# Patient Record
Sex: Female | Born: 1980 | Marital: Single | Smoking: Never smoker
Health system: Southern US, Community
[De-identification: ages and names within clinical notes are randomized; demographics above are authoritative.]

## PROBLEM LIST (undated history)

## (undated) DIAGNOSIS — E119 Type 2 diabetes mellitus without complications: Secondary | ICD-10-CM

## (undated) HISTORY — DX: Type 2 diabetes mellitus without complications: E11.9

## (undated) HISTORY — PX: NO PAST SURGERIES: SHX2092

---

## 2011-12-08 ENCOUNTER — Ambulatory Visit: Payer: Self-pay | Admitting: Obstetrics and Gynecology

## 2012-05-27 ENCOUNTER — Ambulatory Visit: Payer: 59

## 2012-05-27 ENCOUNTER — Ambulatory Visit (INDEPENDENT_AMBULATORY_CARE_PROVIDER_SITE_OTHER): Payer: 59 | Admitting: Family Medicine

## 2012-05-27 VITALS — BP 100/69 | HR 102 | Temp 98.7°F | Resp 17 | Ht 68.5 in | Wt 140.0 lb

## 2012-05-27 DIAGNOSIS — IMO0001 Reserved for inherently not codable concepts without codable children: Secondary | ICD-10-CM | POA: Insufficient documentation

## 2012-05-27 DIAGNOSIS — R05 Cough: Secondary | ICD-10-CM

## 2012-05-27 DIAGNOSIS — Z794 Long term (current) use of insulin: Secondary | ICD-10-CM

## 2012-05-27 DIAGNOSIS — R059 Cough, unspecified: Secondary | ICD-10-CM

## 2012-05-27 DIAGNOSIS — E119 Type 2 diabetes mellitus without complications: Secondary | ICD-10-CM

## 2012-05-27 DIAGNOSIS — R509 Fever, unspecified: Secondary | ICD-10-CM

## 2012-05-27 LAB — POCT CBC
Granulocyte percent: 55.3 %G (ref 37–80)
HCT, POC: 40.3 % (ref 37.7–47.9)
Hemoglobin: 12.6 g/dL (ref 12.2–16.2)
Lymph, poc: 1.8 (ref 0.6–3.4)
MCH, POC: 28.4 pg (ref 27–31.2)
MCHC: 31.3 g/dL — AB (ref 31.8–35.4)
MCV: 90.8 fL (ref 80–97)
MID (cbc): 0.4 (ref 0–0.9)
MPV: 10.4 fL (ref 0–99.8)
POC Granulocyte: 2.7 (ref 2–6.9)
POC LYMPH PERCENT: 36.2 %L (ref 10–50)
POC MID %: 8.5 %M (ref 0–12)
Platelet Count, POC: 181 10*3/uL (ref 142–424)
RBC: 4.44 M/uL (ref 4.04–5.48)
RDW, POC: 12.4 %
WBC: 4.9 10*3/uL (ref 4.6–10.2)

## 2012-05-27 MED ORDER — AZITHROMYCIN 250 MG PO TABS: ORAL_TABLET | ORAL | Status: DC

## 2012-05-27 NOTE — Progress Notes (Signed)
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  The cough has persisted.  Objective:  NAD, alert and appropriate HEENT:  Unremarkable. Chest: few rales anterior right chest Heart: regular around 90 bpm, no murmur or gallop Ext:  No edema.  [facility] reading (PRIMARY) by  Dr. Milus Glazier  CXR- right middle lobe infiltrate  Results for orders placed in visit on 05/27/12  POCT CBC      Result Value Range   WBC 4.9  4.6 - 10.2 K/uL   Lymph, poc 1.8  0.6 - 3.4   POC LYMPH PERCENT 36.2  10 - 50 %L   MID (cbc) 0.4  0 - 0.9   POC MID % 8.5  0 - 12 %M   POC Granulocyte 2.7  2 - 6.9   Granulocyte percent 55.3  37 - 80 %G   RBC 4.44  4.04 - 5.48 M/uL   Hemoglobin 12.6  12.2 - 16.2 g/dL   HCT, POC 16.1  09.6 - 47.9 %   MCV 90.8  80 - 97 fL   MCH, POC 28.4  27 - 31.2 pg   MCHC 31.3 (*) 31.8 - 35.4 g/dL   RDW, POC 04.5     Platelet Count, POC 181  142 - 424 K/uL   MPV 10.4  0 - 99.8 fL   Assessment: atypical pneumonia without respiratory compromise  Plan:  z pak.

## 2012-05-27 NOTE — Patient Instructions (Signed)

## 2012-11-20 ENCOUNTER — Ambulatory Visit: Payer: Self-pay | Admitting: *Deleted

## 2013-07-10 ENCOUNTER — Encounter: Payer: Self-pay | Admitting: *Deleted

## 2013-07-10 ENCOUNTER — Encounter: Payer: 59 | Attending: Endocrinology | Admitting: *Deleted

## 2013-07-10 VITALS — Ht 67.0 in | Wt 150.9 lb

## 2013-07-10 DIAGNOSIS — Z713 Dietary counseling and surveillance: Secondary | ICD-10-CM | POA: Insufficient documentation

## 2013-07-10 DIAGNOSIS — O24919 Unspecified diabetes mellitus in pregnancy, unspecified trimester: Secondary | ICD-10-CM | POA: Insufficient documentation

## 2013-07-10 DIAGNOSIS — Z794 Long term (current) use of insulin: Secondary | ICD-10-CM

## 2013-07-10 DIAGNOSIS — IMO0001 Reserved for inherently not codable concepts without codable children: Secondary | ICD-10-CM

## 2013-07-10 DIAGNOSIS — E119 Type 2 diabetes mellitus without complications: Secondary | ICD-10-CM | POA: Insufficient documentation

## 2013-07-10 NOTE — Progress Notes (Signed)
  Medical Nutrition Therapy:  Appt start time: 0800 end time:  0900.  Assessment:  Primary concerns today: DM 1 with new pregnancy. Lives with husband, they food shop together, she does the cooking of meals. SMBG 8 times a day with reported range of up to 150 during the day, higher fasting though. Carb Ratio is 1/15 grams carbohydrate, Correction range of 100-150 +1, 151-200 +2, etc but no corrections between meals right now. 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 Enjoys being outside.  Preferred Learning Style:   No preference indicated   Learning Readiness:   Ready  Change in progress  MEDICATIONS: see list. Currently on Lantus and Humalog with carb ratio of 1/15 and correction of 1/50 above 100 mg/dl   DIETARY INTAKE:  24-hr recall:  B ( AM): fresh fruit and yogurt, water  Snk ( AM): 1/2 of granola bar or more fresh fruit  L ( PM): brings from home: salad with croutons, eggs, meat, [place] dressing, water Snk ( PM): same as AM, maybe popcorn D ( PM): meat and vegetables, rarely a starch since becoming pregnant Snk ( PM): tries to eat protein occasionally with fruit Beverages: water  Usual physical activity: walks occasionally  Estimated energy needs: for pregnancy 2000 calories 225 g carbohydrates 150 g protein 56 g fat  Progress Towards Goal(s):  In progress.   Nutritional Diagnosis:  NB-1.1 Food and nutrition-related knowledge deficit As related to diabetes control.  As evidenced by A1c of 8.4%.    Intervention:  Nutrition counseling for pregnancyand diabetes education initiated. Discussed Carb Counting as method of BG and portion control, reading food labels, and benefits of increased activity. Provided information on pregnancy guidelines with diabetes and recommended she increase her carb intake to about 30 grams per meal and each snack to provide adequate calories for growth of the baby and her own body. Also advised her that  once her Carb Ratio is adjusted for better post meal BG control, to increase to 45 grams at her lunch and dinner meals as needed. Mentioned option of insulin pump for her pregnancy which she initially declined. Reviewed advantages of pump therapy including the smaller increments of delivery allowing better fine tuning of food and correction boluses, ability to set various basal rates and the fact that her insulin requirements can double to triple by the end of her pregnancy. Also suggested the consideration of the Pediatric insulin pen that delivers in 0.5 units that would allow for tighter correction doses as well.  Plan:  Aim for 2 Carb Choices per meal (30 grams) +/- 1 either way  Aim for 1-2 Carbs p er snack during pregnancy  Include protein in moderation with your meals and snacks Consider reading food labels for Total Carbohydrate of foods Consider walking daily as tolerated Continue checking BG as directed by MD  Check into mercury content of salmon at various stores  Teaching Method Utilized: Visual and Auditory  Handouts given during visit include: Carb Counting and Food Label handouts Meal Plan Card GDM handout for pregnancy guidelines only  Barriers to learning/adherence to lifestyle change: none  Demonstrated degree of understanding via:  Teach Back   Monitoring/Evaluation:  Dietary intake, exercise, SMBG, and body weight prn.

## 2013-07-10 NOTE — Patient Instructions (Signed)
Plan:  Aim for 2 Carb Choices per meal (30 grams) +/- 1 either way  Aim for 1-2 Carbs p er snack during pregnancy  Include protein in moderation with your meals and snacks Consider reading food labels for Total Carbohydrate of foods Consider walking daily as tolerated Continue checking BG as directed by MD  Check into mercury content of salmon at various stores

## 2013-12-03 ENCOUNTER — Encounter: Payer: Self-pay | Admitting: *Deleted

## 2014-02-08 ENCOUNTER — Encounter (HOSPITAL_COMMUNITY): Payer: Self-pay | Admitting: *Deleted

## 2014-02-08 ENCOUNTER — Inpatient Hospital Stay (HOSPITAL_COMMUNITY): Payer: 59

## 2014-02-08 ENCOUNTER — Inpatient Hospital Stay (HOSPITAL_COMMUNITY)
Admission: AD | Admit: 2014-02-08 | Discharge: 2014-02-08 | Disposition: A | Discharge status: Home / Self Care | Payer: 59 | Source: Ambulatory Visit | Attending: Obstetrics and Gynecology | Admitting: Obstetrics and Gynecology

## 2014-02-08 DIAGNOSIS — Z3A36 36 weeks gestation of pregnancy: Secondary | ICD-10-CM | POA: Insufficient documentation

## 2014-02-08 DIAGNOSIS — O3413 Maternal care for benign tumor of corpus uteri, third trimester: Secondary | ICD-10-CM | POA: Insufficient documentation

## 2014-02-08 DIAGNOSIS — O289 Unspecified abnormal findings on antenatal screening of mother: Secondary | ICD-10-CM | POA: Diagnosis not present

## 2014-02-08 DIAGNOSIS — O288 Other abnormal findings on antenatal screening of mother: Secondary | ICD-10-CM | POA: Insufficient documentation

## 2014-02-08 DIAGNOSIS — D259 Leiomyoma of uterus, unspecified: Secondary | ICD-10-CM | POA: Insufficient documentation

## 2014-02-08 NOTE — MAU Provider Note (Signed)
  History     CSN: [id number]  Arrival date and time: 02/08/14 1137 Provider here - patient in restroom en route to [facility] @ 1300 Provider back to see patient @ 1600    Chief Complaint  Patient presents with  . Non-stress Test   HPI  Here for NST - non-reactive in office  Past Medical History  Diagnosis Date  . Diabetes mellitus without complication     Past Surgical History  Procedure Laterality Date  . No past surgeries      History reviewed. No pertinent family history.  History  Substance Use Topics  . Smoking status: Never Smoker   . Smokeless tobacco: Never Used  . Alcohol Use: No    Allergies:  Allergies  Allergen Reactions  . Sulfa Antibiotics Other (See Comments)    shaking    Prescriptions prior to admission  Medication Sig Dispense Refill Last Dose  . insulin glargine (LANTUS) 100 UNIT/ML injection Inject 39 Units into the skin at bedtime.    02/07/2014 at Unknown time  . insulin lispro (HUMALOG) 100 UNIT/ML injection Inject 2-40 Units into the skin 3 (three) times daily before meals. Sliding scale based on meals   02/08/2014 at Unknown time  . Prenatal Vit-Fe Fumarate-FA (PRENATAL MULTIVITAMIN) TABS tablet Take 1 tablet by mouth daily at 12 noon.   02/07/2014 at Unknown time  . vitamin C (ASCORBIC ACID) 500 MG tablet Take 500 mg by mouth daily.   Past Week at Unknown time  . azithromycin (ZITHROMAX Z-PAK) 250 MG tablet Take as directed on pack (Patient not taking: Reported on 02/08/2014) 6 tablet 0     ROS  (+) FM No ctx Feels well Physical Exam   Blood pressure 126/80, pulse 95, temperature 98.2 F (36.8 C), resp. rate 18, height 5\' 7"  (1.702 m), weight 85.276 kg (188 lb), last menstrual period 05/28/2013.  Physical Exam Alert and oriented - NAD Abdomen soft and non-tender MAU Course  Procedures NST - reactive Assessment and Plan   Reactive NST / normal AFI with BPP 8-8 Keep next appointment in office  [facility]  Artelia Laroche 02/08/2014, 4:25 PM

## 2014-02-08 NOTE — MAU Note (Signed)
Pt presents to MAU from physicians office for non stress test, prolonged monitoring, and U/S for BPP

## 2014-02-08 NOTE — MAU Note (Signed)
Urine in lab 

## 2014-02-08 NOTE — Discharge Instructions (Signed)

## 2014-02-20 ENCOUNTER — Encounter (HOSPITAL_COMMUNITY): Payer: Self-pay

## 2014-02-20 ENCOUNTER — Inpatient Hospital Stay (HOSPITAL_COMMUNITY): Payer: 59 | Admitting: Anesthesiology

## 2014-02-20 ENCOUNTER — Ambulatory Visit (HOSPITAL_COMMUNITY)
Admission: RE | Admit: 2014-02-20 | Discharge: 2014-02-20 | Disposition: A | Discharge status: Home / Self Care | Payer: 59 | Source: Ambulatory Visit | Attending: Obstetrics and Gynecology | Admitting: Obstetrics and Gynecology

## 2014-02-20 ENCOUNTER — Encounter (HOSPITAL_COMMUNITY): Payer: Self-pay | Admitting: *Deleted

## 2014-02-20 ENCOUNTER — Inpatient Hospital Stay (HOSPITAL_COMMUNITY)
Admission: AD | Admit: 2014-02-20 | Discharge: 2014-02-23 | DRG: 765 | Disposition: A | Discharge status: Home / Self Care | Payer: 59 | Source: Ambulatory Visit | Attending: Obstetrics & Gynecology | Admitting: Obstetrics & Gynecology

## 2014-02-20 ENCOUNTER — Other Ambulatory Visit (HOSPITAL_COMMUNITY): Payer: Self-pay

## 2014-02-20 ENCOUNTER — Encounter (HOSPITAL_COMMUNITY)
Admission: AD | Disposition: A | Discharge status: Home / Self Care | Payer: Self-pay | Source: Ambulatory Visit | Attending: Obstetrics & Gynecology

## 2014-02-20 ENCOUNTER — Other Ambulatory Visit (HOSPITAL_COMMUNITY): Payer: Self-pay | Admitting: Obstetrics and Gynecology

## 2014-02-20 DIAGNOSIS — D259 Leiomyoma of uterus, unspecified: Secondary | ICD-10-CM | POA: Diagnosis present

## 2014-02-20 DIAGNOSIS — E119 Type 2 diabetes mellitus without complications: Secondary | ICD-10-CM | POA: Diagnosis present

## 2014-02-20 DIAGNOSIS — Z3490 Encounter for supervision of normal pregnancy, unspecified, unspecified trimester: Secondary | ICD-10-CM

## 2014-02-20 DIAGNOSIS — O283 Abnormal ultrasonic finding on antenatal screening of mother: Secondary | ICD-10-CM | POA: Insufficient documentation

## 2014-02-20 DIAGNOSIS — D6959 Other secondary thrombocytopenia: Secondary | ICD-10-CM | POA: Diagnosis present

## 2014-02-20 DIAGNOSIS — Z794 Long term (current) use of insulin: Secondary | ICD-10-CM | POA: Insufficient documentation

## 2014-02-20 DIAGNOSIS — O2412 Pre-existing diabetes mellitus, type 2, in childbirth: Secondary | ICD-10-CM | POA: Diagnosis present

## 2014-02-20 DIAGNOSIS — O3413 Maternal care for benign tumor of corpus uteri, third trimester: Secondary | ICD-10-CM | POA: Diagnosis present

## 2014-02-20 DIAGNOSIS — Z9889 Other specified postprocedural states: Secondary | ICD-10-CM

## 2014-02-20 DIAGNOSIS — D62 Acute posthemorrhagic anemia: Secondary | ICD-10-CM | POA: Diagnosis present

## 2014-02-20 DIAGNOSIS — O24113 Pre-existing diabetes mellitus, type 2, in pregnancy, third trimester: Secondary | ICD-10-CM

## 2014-02-20 DIAGNOSIS — Z3A38 38 weeks gestation of pregnancy: Secondary | ICD-10-CM | POA: Diagnosis present

## 2014-02-20 DIAGNOSIS — O9902 Anemia complicating childbirth: Secondary | ICD-10-CM | POA: Diagnosis present

## 2014-02-20 DIAGNOSIS — Z36 Encounter for antenatal screening of mother: Secondary | ICD-10-CM

## 2014-02-20 DIAGNOSIS — IMO0001 Reserved for inherently not codable concepts without codable children: Secondary | ICD-10-CM

## 2014-02-20 DIAGNOSIS — O358XX0 Maternal care for other (suspected) fetal abnormality and damage, not applicable or unspecified: Secondary | ICD-10-CM | POA: Insufficient documentation

## 2014-02-20 DIAGNOSIS — O24013 Pre-existing diabetes mellitus, type 1, in pregnancy, third trimester: Secondary | ICD-10-CM | POA: Insufficient documentation

## 2014-02-20 DIAGNOSIS — O9912 Other diseases of the blood and blood-forming organs and certain disorders involving the immune mechanism complicating childbirth: Secondary | ICD-10-CM | POA: Diagnosis present

## 2014-02-20 LAB — CBC
HEMATOCRIT: 41.4 % (ref 36.0–46.0)
HEMOGLOBIN: 14.1 g/dL (ref 12.0–15.0)
MCH: 29.9 pg (ref 26.0–34.0)
MCHC: 34.1 g/dL (ref 30.0–36.0)
MCV: 87.9 fL (ref 78.0–100.0)
Platelets: 145 10*3/uL — ABNORMAL LOW (ref 150–400)
RBC: 4.71 MIL/uL (ref 3.87–5.11)
RDW: 14.2 % (ref 11.5–15.5)
WBC: 12.7 10*3/uL — AB (ref 4.0–10.5)

## 2014-02-20 LAB — GLUCOSE, CAPILLARY
GLUCOSE-CAPILLARY: 88 mg/dL (ref 70–99)
GLUCOSE-CAPILLARY: 123 mg/dL — AB (ref 70–99)
Glucose-Capillary: 124 mg/dL — ABNORMAL HIGH (ref 70–99)
Glucose-Capillary: 119 mg/dL — ABNORMAL HIGH (ref 70–99)
Glucose-Capillary: 73 mg/dL (ref 70–99)
Glucose-Capillary: 68 mg/dL — ABNORMAL LOW (ref 70–99)
Glucose-Capillary: 64 mg/dL — ABNORMAL LOW (ref 70–99)
Glucose-Capillary: 96 mg/dL (ref 70–99)

## 2014-02-20 LAB — OB RESULTS CONSOLE ABO/RH: RH Type: POSITIVE

## 2014-02-20 LAB — OB RESULTS CONSOLE RPR: RPR: NONREACTIVE

## 2014-02-20 LAB — OB RESULTS CONSOLE RUBELLA ANTIBODY, IGM: RUBELLA: IMMUNE

## 2014-02-20 LAB — OB RESULTS CONSOLE GBS: STREP GROUP B AG: POSITIVE

## 2014-02-20 LAB — OB RESULTS CONSOLE HEPATITIS B SURFACE ANTIGEN: Hepatitis B Surface Ag: NEGATIVE

## 2014-02-20 LAB — OB RESULTS CONSOLE ANTIBODY SCREEN: Antibody Screen: NEGATIVE

## 2014-02-20 LAB — GLUCOSE, RANDOM: GLUCOSE: 139 mg/dL — AB (ref 70–99)

## 2014-02-20 LAB — OB RESULTS CONSOLE HIV ANTIBODY (ROUTINE TESTING): HIV: NONREACTIVE

## 2014-02-20 LAB — ABO/RH: ABO/RH(D): O POS

## 2014-02-20 SURGERY — Surgical Case
Anesthesia: Spinal

## 2014-02-20 MED ORDER — SODIUM CHLORIDE 0.9 % IV SOLN: 2.0000 g | Freq: Four times a day (QID) | INTRAVENOUS | Status: DC

## 2014-02-20 MED ORDER — LACTATED RINGERS IV SOLN
40.0000 [IU] | INTRAVENOUS | Status: DC | PRN
  Administered 2014-02-20: 40 [IU] via INTRAVENOUS

## 2014-02-20 MED ORDER — PHENYLEPHRINE 8 MG IN D5W 100 ML (0.08MG/ML) PREMIX OPTIME
INJECTION | INTRAVENOUS | Status: DC | PRN
  Administered 2014-02-20: 60 ug/min via INTRAVENOUS

## 2014-02-20 MED ORDER — LACTATED RINGERS IV SOLN
INTRAVENOUS | Status: DC
  Administered 2014-02-20 (×2): via INTRAVENOUS

## 2014-02-20 MED ORDER — DEXTROSE 50 % IV SOLN
25.0000 mL | Freq: Once | INTRAVENOUS | Status: DC
Start: 2014-02-20 — End: 2014-02-20

## 2014-02-20 MED ORDER — LIDOCAINE HCL (PF) 1 % IJ SOLN: 30.0000 mL | INTRAMUSCULAR | Status: DC | PRN

## 2014-02-20 MED ORDER — SCOPOLAMINE 1 MG/3DAYS TD PT72
1.0000 | MEDICATED_PATCH | Freq: Once | TRANSDERMAL | Status: AC
  Administered 2014-02-20: 1.5 mg via TRANSDERMAL

## 2014-02-20 MED ORDER — ONDANSETRON HCL 4 MG/2ML IJ SOLN
INTRAMUSCULAR | Status: DC | PRN
  Administered 2014-02-20: 4 mg via INTRAVENOUS

## 2014-02-20 MED ORDER — IBUPROFEN 600 MG PO TABS: 600.0000 mg | ORAL_TABLET | Freq: Four times a day (QID) | ORAL | Status: DC | PRN

## 2014-02-20 MED ORDER — DIPHENHYDRAMINE HCL 25 MG PO CAPS: 25.0000 mg | ORAL_CAPSULE | ORAL | Status: DC | PRN

## 2014-02-20 MED ORDER — IBUPROFEN 600 MG PO TABS
600.0000 mg | ORAL_TABLET | Freq: Four times a day (QID) | ORAL | Status: DC
  Administered 2014-02-21 – 2014-02-23 (×11): 600 mg via ORAL
  Filled 2014-02-20 (×11): qty 1

## 2014-02-20 MED ORDER — DIBUCAINE 1 % RE OINT: 1.0000 "application " | TOPICAL_OINTMENT | RECTAL | Status: DC | PRN

## 2014-02-20 MED ORDER — KETOROLAC TROMETHAMINE 30 MG/ML IJ SOLN
INTRAMUSCULAR | Status: AC
  Administered 2014-02-20: 30 mg via INTRAVENOUS
  Filled 2014-02-20: qty 1

## 2014-02-20 MED ORDER — FERROUS SULFATE 325 (65 FE) MG PO TABS
325.0000 mg | ORAL_TABLET | Freq: Two times a day (BID) | ORAL | Status: DC
  Administered 2014-02-21 – 2014-02-23 (×5): 325 mg via ORAL
  Filled 2014-02-20 (×5): qty 1

## 2014-02-20 MED ORDER — OXYCODONE-ACETAMINOPHEN 5-325 MG PO TABS
1.0000 | ORAL_TABLET | ORAL | Status: DC | PRN
  Administered 2014-02-22 (×2): 1 via ORAL
  Filled 2014-02-20 (×2): qty 1

## 2014-02-20 MED ORDER — MORPHINE SULFATE 0.5 MG/ML IJ SOLN
INTRAMUSCULAR | Status: AC
  Filled 2014-02-20: qty 10

## 2014-02-20 MED ORDER — LANOLIN HYDROUS EX OINT: 1.0000 "application " | TOPICAL_OINTMENT | CUTANEOUS | Status: DC | PRN

## 2014-02-20 MED ORDER — INSULIN GLARGINE 100 UNIT/ML ~~LOC~~ SOLN
15.0000 [IU] | Freq: Every day | SUBCUTANEOUS | Status: DC
  Administered 2014-02-22 – 2014-02-23 (×2): 15 [IU] via SUBCUTANEOUS
  Filled 2014-02-20 (×4): qty 0.15

## 2014-02-20 MED ORDER — OXYCODONE-ACETAMINOPHEN 5-325 MG PO TABS: 2.0000 | ORAL_TABLET | ORAL | Status: DC | PRN

## 2014-02-20 MED ORDER — DIPHENHYDRAMINE HCL 50 MG/ML IJ SOLN: 12.5000 mg | INTRAMUSCULAR | Status: DC | PRN

## 2014-02-20 MED ORDER — SIMETHICONE 80 MG PO CHEW
80.0000 mg | CHEWABLE_TABLET | ORAL | Status: DC
  Administered 2014-02-21 – 2014-02-23 (×3): 80 mg via ORAL
  Filled 2014-02-20 (×3): qty 1

## 2014-02-20 MED ORDER — PROMETHAZINE HCL 25 MG/ML IJ SOLN: 6.2500 mg | INTRAMUSCULAR | Status: DC | PRN

## 2014-02-20 MED ORDER — ONDANSETRON HCL 4 MG PO TABS: 4.0000 mg | ORAL_TABLET | ORAL | Status: DC | PRN

## 2014-02-20 MED ORDER — OXYTOCIN 40 UNITS IN LACTATED RINGERS INFUSION - SIMPLE MED: 62.5000 mL/h | INTRAVENOUS | Status: AC

## 2014-02-20 MED ORDER — ONDANSETRON HCL 4 MG/2ML IJ SOLN: 4.0000 mg | INTRAMUSCULAR | Status: DC | PRN

## 2014-02-20 MED ORDER — SIMETHICONE 80 MG PO CHEW: 80.0000 mg | CHEWABLE_TABLET | ORAL | Status: DC | PRN

## 2014-02-20 MED ORDER — SCOPOLAMINE 1 MG/3DAYS TD PT72
MEDICATED_PATCH | TRANSDERMAL | Status: DC | PRN
  Administered 2014-02-20: 1 via TRANSDERMAL

## 2014-02-20 MED ORDER — OXYTOCIN 10 UNIT/ML IJ SOLN
INTRAMUSCULAR | Status: AC
  Filled 2014-02-20: qty 4

## 2014-02-20 MED ORDER — NALBUPHINE HCL 10 MG/ML IJ SOLN: 5.0000 mg | INTRAMUSCULAR | Status: DC | PRN

## 2014-02-20 MED ORDER — DEXTROSE 50 % IV SOLN
INTRAVENOUS | Status: AC
  Filled 2014-02-20: qty 50

## 2014-02-20 MED ORDER — NALOXONE HCL 1 MG/ML IJ SOLN
1.0000 ug/kg/h | INTRAVENOUS | Status: DC | PRN
  Filled 2014-02-20: qty 2

## 2014-02-20 MED ORDER — SIMETHICONE 80 MG PO CHEW
80.0000 mg | CHEWABLE_TABLET | Freq: Three times a day (TID) | ORAL | Status: DC
  Administered 2014-02-21 – 2014-02-23 (×4): 80 mg via ORAL
  Filled 2014-02-20 (×4): qty 1

## 2014-02-20 MED ORDER — LACTATED RINGERS IV SOLN
INTRAVENOUS | Status: DC
  Administered 2014-02-21: 02:00:00 via INTRAVENOUS

## 2014-02-20 MED ORDER — OXYCODONE-ACETAMINOPHEN 5-325 MG PO TABS: 1.0000 | ORAL_TABLET | ORAL | Status: DC | PRN

## 2014-02-20 MED ORDER — WITCH HAZEL-GLYCERIN EX PADS: 1.0000 "application " | MEDICATED_PAD | CUTANEOUS | Status: DC | PRN

## 2014-02-20 MED ORDER — SODIUM CHLORIDE 0.9 % IR SOLN
Status: DC | PRN
  Administered 2014-02-20: 1000 mL

## 2014-02-20 MED ORDER — MAGNESIUM HYDROXIDE 400 MG/5ML PO SUSP: 30.0000 mL | ORAL | Status: DC | PRN

## 2014-02-20 MED ORDER — ACETAMINOPHEN 325 MG PO TABS: 650.0000 mg | ORAL_TABLET | ORAL | Status: DC | PRN

## 2014-02-20 MED ORDER — BUPIVACAINE HCL (PF) 0.25 % IJ SOLN
INTRAMUSCULAR | Status: DC | PRN
  Administered 2014-02-20: 10 mL

## 2014-02-20 MED ORDER — CITRIC ACID-SODIUM CITRATE 334-500 MG/5ML PO SOLN: 30.0000 mL | ORAL | Status: DC | PRN

## 2014-02-20 MED ORDER — NALBUPHINE HCL 10 MG/ML IJ SOLN: 5.0000 mg | Freq: Once | INTRAMUSCULAR | Status: AC | PRN

## 2014-02-20 MED ORDER — PRENATAL MULTIVITAMIN CH
1.0000 | ORAL_TABLET | Freq: Every day | ORAL | Status: DC
  Administered 2014-02-21 – 2014-02-22 (×2): 1 via ORAL
  Filled 2014-02-20 (×2): qty 1

## 2014-02-20 MED ORDER — CEFAZOLIN SODIUM-DEXTROSE 2-3 GM-% IV SOLR: 2.0000 g | Freq: Three times a day (TID) | INTRAVENOUS | Status: DC

## 2014-02-20 MED ORDER — KETOROLAC TROMETHAMINE 30 MG/ML IJ SOLN
30.0000 mg | Freq: Four times a day (QID) | INTRAMUSCULAR | Status: DC | PRN
  Administered 2014-02-20: 30 mg via INTRAVENOUS

## 2014-02-20 MED ORDER — INSULIN LISPRO 100 UNIT/ML ~~LOC~~ SOLN
2.0000 [IU] | Freq: Three times a day (TID) | SUBCUTANEOUS | Status: DC
  Filled 2014-02-20: qty 10

## 2014-02-20 MED ORDER — FENTANYL CITRATE 0.05 MG/ML IJ SOLN
INTRAMUSCULAR | Status: DC | PRN
  Administered 2014-02-20: 12.5 ug via INTRATHECAL

## 2014-02-20 MED ORDER — SENNOSIDES-DOCUSATE SODIUM 8.6-50 MG PO TABS
2.0000 | ORAL_TABLET | ORAL | Status: DC
  Administered 2014-02-21 – 2014-02-23 (×3): 2 via ORAL
  Filled 2014-02-20 (×3): qty 2

## 2014-02-20 MED ORDER — DIPHENHYDRAMINE HCL 25 MG PO CAPS: 25.0000 mg | ORAL_CAPSULE | Freq: Four times a day (QID) | ORAL | Status: DC | PRN

## 2014-02-20 MED ORDER — MEPERIDINE HCL 25 MG/ML IJ SOLN: 6.2500 mg | INTRAMUSCULAR | Status: DC | PRN

## 2014-02-20 MED ORDER — OXYTOCIN BOLUS FROM INFUSION: 500.0000 mL | INTRAVENOUS | Status: DC

## 2014-02-20 MED ORDER — PHENYLEPHRINE 8 MG IN D5W 100 ML (0.08MG/ML) PREMIX OPTIME
INJECTION | INTRAVENOUS | Status: AC
  Filled 2014-02-20: qty 100

## 2014-02-20 MED ORDER — OXYTOCIN 40 UNITS IN LACTATED RINGERS INFUSION - SIMPLE MED: 62.5000 mL/h | INTRAVENOUS | Status: DC

## 2014-02-20 MED ORDER — ONDANSETRON HCL 4 MG/2ML IJ SOLN
INTRAMUSCULAR | Status: AC
  Filled 2014-02-20: qty 2

## 2014-02-20 MED ORDER — HYDROMORPHONE HCL 1 MG/ML IJ SOLN: 0.2500 mg | INTRAMUSCULAR | Status: DC | PRN

## 2014-02-20 MED ORDER — SCOPOLAMINE 1 MG/3DAYS TD PT72
MEDICATED_PATCH | TRANSDERMAL | Status: AC
  Filled 2014-02-20: qty 1

## 2014-02-20 MED ORDER — SODIUM CHLORIDE 0.9 % IJ SOLN: 3.0000 mL | INTRAMUSCULAR | Status: DC | PRN

## 2014-02-20 MED ORDER — MORPHINE SULFATE (PF) 0.5 MG/ML IJ SOLN
INTRAMUSCULAR | Status: DC | PRN
  Administered 2014-02-20: .2 mg via INTRATHECAL

## 2014-02-20 MED ORDER — INSULIN GLARGINE 100 UNIT/ML ~~LOC~~ SOLN
15.0000 [IU] | Freq: Every day | SUBCUTANEOUS | Status: DC
  Filled 2014-02-20: qty 0.15

## 2014-02-20 MED ORDER — CEFAZOLIN SODIUM-DEXTROSE 2-3 GM-% IV SOLR
2.0000 g | Freq: Once | INTRAVENOUS | Status: AC
  Administered 2014-02-20: 2 g via INTRAVENOUS
  Filled 2014-02-20: qty 50

## 2014-02-20 MED ORDER — MENTHOL 3 MG MT LOZG
1.0000 | LOZENGE | OROMUCOSAL | Status: DC | PRN
  Filled 2014-02-20: qty 9

## 2014-02-20 MED ORDER — OXYCODONE-ACETAMINOPHEN 5-325 MG PO TABS
2.0000 | ORAL_TABLET | ORAL | Status: DC | PRN
  Administered 2014-02-23: 2 via ORAL
  Filled 2014-02-20: qty 2

## 2014-02-20 MED ORDER — BUPIVACAINE IN DEXTROSE 0.75-8.25 % IT SOLN
INTRATHECAL | Status: DC | PRN
  Administered 2014-02-20: 1 mL via INTRATHECAL

## 2014-02-20 MED ORDER — CITRIC ACID-SODIUM CITRATE 334-500 MG/5ML PO SOLN
ORAL | Status: AC
  Administered 2014-02-20: 30 mL
  Filled 2014-02-20: qty 15

## 2014-02-20 MED ORDER — NALOXONE HCL 0.4 MG/ML IJ SOLN: 0.4000 mg | INTRAMUSCULAR | Status: DC | PRN

## 2014-02-20 MED ORDER — BUPIVACAINE HCL (PF) 0.25 % IJ SOLN
INTRAMUSCULAR | Status: AC
  Filled 2014-02-20: qty 30

## 2014-02-20 MED ORDER — ONDANSETRON HCL 4 MG/2ML IJ SOLN: 4.0000 mg | Freq: Four times a day (QID) | INTRAMUSCULAR | Status: DC | PRN

## 2014-02-20 MED ORDER — ZOLPIDEM TARTRATE 5 MG PO TABS: 5.0000 mg | ORAL_TABLET | Freq: Every evening | ORAL | Status: DC | PRN

## 2014-02-20 MED ORDER — ONDANSETRON HCL 4 MG/2ML IJ SOLN: 4.0000 mg | Freq: Three times a day (TID) | INTRAMUSCULAR | Status: DC | PRN

## 2014-02-20 MED ORDER — LACTATED RINGERS IV SOLN: 500.0000 mL | INTRAVENOUS | Status: DC | PRN

## 2014-02-20 MED ORDER — FLEET ENEMA 7-19 GM/118ML RE ENEM: 1.0000 | ENEMA | Freq: Every day | RECTAL | Status: DC | PRN

## 2014-02-20 MED ORDER — KETOROLAC TROMETHAMINE 30 MG/ML IJ SOLN: 30.0000 mg | Freq: Four times a day (QID) | INTRAMUSCULAR | Status: DC | PRN

## 2014-02-20 MED ORDER — TETANUS-DIPHTH-ACELL PERTUSSIS 5-2.5-18.5 LF-MCG/0.5 IM SUSP: 0.5000 mL | Freq: Once | INTRAMUSCULAR | Status: DC

## 2014-02-20 MED ORDER — FENTANYL CITRATE 0.05 MG/ML IJ SOLN
INTRAMUSCULAR | Status: AC
  Filled 2014-02-20: qty 2

## 2014-02-20 MED ORDER — DEXTROSE 50 % IV SOLN
25.0000 mL | Freq: Once | INTRAVENOUS | Status: AC
  Administered 2014-02-20: 25 mL via INTRAVENOUS

## 2014-02-20 SURGICAL SUPPLY — 48 items
BARRIER ADHS 3X4 INTERCEED (GAUZE/BANDAGES/DRESSINGS) ×3 IMPLANT
BENZOIN TINCTURE PRP APPL 2/3 (GAUZE/BANDAGES/DRESSINGS) IMPLANT
CLAMP CORD UMBIL (MISCELLANEOUS) IMPLANT
CLOSURE WOUND 1/2 X4 (GAUZE/BANDAGES/DRESSINGS)
CLOTH BEACON ORANGE TIMEOUT ST (SAFETY) ×3 IMPLANT
CONTAINER PREFILL 10% NBF 15ML (MISCELLANEOUS) IMPLANT
DERMABOND ADHESIVE PROPEN (GAUZE/BANDAGES/DRESSINGS) ×2
DERMABOND ADVANCED .7 DNX6 (GAUZE/BANDAGES/DRESSINGS) ×1 IMPLANT
DRAPE SHEET LG 3/4 BI-LAMINATE (DRAPES) IMPLANT
DRSG OPSITE POSTOP 4X10 (GAUZE/BANDAGES/DRESSINGS) ×3 IMPLANT
DURAPREP 26ML APPLICATOR (WOUND CARE) ×3 IMPLANT
ELECT REM PT RETURN 9FT ADLT (ELECTROSURGICAL) ×3
ELECTRODE REM PT RTRN 9FT ADLT (ELECTROSURGICAL) ×1 IMPLANT
EXTRACTOR VACUUM M CUP 4 TUBE (SUCTIONS) IMPLANT
EXTRACTOR VACUUM M CUP 4' TUBE (SUCTIONS)
GLOVE BIOGEL PI IND STRL 7.0 (GLOVE) ×1 IMPLANT
GLOVE BIOGEL PI INDICATOR 7.0 (GLOVE) ×2
GLOVE ECLIPSE 6.5 STRL STRAW (GLOVE) ×3 IMPLANT
GOWN STRL REUS W/TWL LRG LVL3 (GOWN DISPOSABLE) ×6 IMPLANT
KIT ABG SYR 3ML LUER SLIP (SYRINGE) IMPLANT
NEEDLE HYPO 25X1 1.5 SAFETY (NEEDLE) ×3 IMPLANT
NEEDLE HYPO 25X5/8 SAFETYGLIDE (NEEDLE) IMPLANT
NS IRRIG 1000ML POUR BTL (IV SOLUTION) ×3 IMPLANT
PACK C SECTION WH (CUSTOM PROCEDURE TRAY) ×3 IMPLANT
PAD ABD 8X7 1/2 STERILE (GAUZE/BANDAGES/DRESSINGS) ×3 IMPLANT
PAD OB MATERNITY 4.3X12.25 (PERSONAL CARE ITEMS) ×3 IMPLANT
RTRCTR C-SECT PINK 25CM LRG (MISCELLANEOUS) IMPLANT
SPONGE GAUZE 4X4 12PLY STER LF (GAUZE/BANDAGES/DRESSINGS) ×3 IMPLANT
STAPLER VISISTAT 35W (STAPLE) IMPLANT
STRIP CLOSURE SKIN 1/2X4 (GAUZE/BANDAGES/DRESSINGS) IMPLANT
SUT CHROMIC GUT AB #0 18 (SUTURE) IMPLANT
SUT MNCRL 0 VIOLET CTX 36 (SUTURE) ×3 IMPLANT
SUT MNCRL AB 3-0 PS2 27 (SUTURE) ×3 IMPLANT
SUT MON AB 4-0 PS1 27 (SUTURE) IMPLANT
SUT MONOCRYL 0 CTX 36 (SUTURE) ×6
SUT PLAIN 2 0 (SUTURE)
SUT PLAIN 2 0 XLH (SUTURE) ×3 IMPLANT
SUT PLAIN ABS 2-0 CT1 27XMFL (SUTURE) IMPLANT
SUT VIC AB 0 CT1 36 (SUTURE) ×6 IMPLANT
SUT VIC AB 2-0 CT1 27 (SUTURE) ×2
SUT VIC AB 2-0 CT1 TAPERPNT 27 (SUTURE) ×1 IMPLANT
SUT VIC AB 2-0 SH 27 (SUTURE) ×2
SUT VIC AB 2-0 SH 27XBRD (SUTURE) ×1 IMPLANT
SUT VIC AB 4-0 PS2 27 (SUTURE) IMPLANT
SYR CONTROL 10ML LL (SYRINGE) ×3 IMPLANT
TAPE CLOTH SURG 4X10 WHT LF (GAUZE/BANDAGES/DRESSINGS) ×3 IMPLANT
TOWEL OR 17X24 6PK STRL BLUE (TOWEL DISPOSABLE) ×3 IMPLANT
TRAY FOLEY CATH 14FR (SET/KITS/TRAYS/PACK) IMPLANT

## 2014-02-20 NOTE — Anesthesia Procedure Notes (Signed)
Spinal Patient location during procedure: OR Start time: 02/20/2014 3:39 PM End time: 02/20/2014 3:42 PM Staffing Anesthesiologist: Lyn Hollingshead Preanesthetic Checklist Completed: patient identified, surgical consent, pre-op evaluation, timeout performed, IV checked, risks and benefits discussed and monitors and equipment checked Spinal Block Patient position: sitting Prep: site prepped and draped and DuraPrep Patient monitoring: heart rate, cardiac monitor, continuous pulse ox and blood pressure Approach: midline Location: L3-4 Injection technique: single-shot Needle Needle type: Pencan  Needle gauge: 24 G Needle length: 9 cm Needle insertion depth: 5 cm Assessment Sensory level: T4

## 2014-02-20 NOTE — Op Note (Signed)
Preoperative diagnosis: Intrauterine pregnancy at 38 weeks and 2 days                                             IDDM                                            Non reassuring fetal well-being with Biophysical Profile of 2/10                                            Fetal Cardiomegaly with Ascites  Post operative diagnosis: Same  Anesthesia: Spinal  Anesthesiologist: Dr. Jillyn Hidden  Procedure: Urgent primary low transverse cesarean section  Surgeon: Dr. Princess Bruins  Assistant: Julianne Handler   Estimated blood loss: 750 cc  Procedure:  After being informed of the planned procedure and possible complications including bleeding, infection, injury to other organs, informed consent is obtained. The patient is taken to OR #9 and given spinal anesthesia without complication. She is placed in the dorsal decubitus position with the pelvis tilted to the left. She is then prepped and draped in a sterile fashion. A Foley catheter is inserted in her bladder.  After assessing adequate level of anesthesia, we perform a Pfannenstiel incision which is brought down sharply to the fascia. The fascia is entered in a low transverse fashion. Linea alba is dissected. Peritoneum is entered in a midline fashion. An Alexis retractor is easily positioned. Visceral peritoneum is entered in a low transverse fashion allowing [place] to safely retract bladder by developing a bladder flap.  The myometrium is then entered in a low transverse fashion; first with knife and then extended bluntly. Amniotic fluid is clear. We assist the birth of a female  infant in cephalic presentation. Mouth and nose are suctioned. The baby is delivered. The cord is clamped and sectioned. The baby is given to the neonatologist present in the room.  I had called the neonatologist before the start of the C/S to inform him of the US findings of Cardiomegaly and Ascites as well as the BPP of 2/10.  A PH was done on the cord which came back at  6.95.  10 cc of blood is drawn from the umbilical vein.The placenta is allowed to deliver spontaneously. It is complete and the cord has 3 vessels. Uterine revision is negative.  We proceed with closure of the myometrium in 2 layers: First with a running locked suture of 0 Vicryl, then with a Lembert suture of 0 Vicryl imbricating the first one. Hemostasis is completed with cauterization on peritoneal edges.  Both paracolic gutters are cleaned. Both tubes and ovaries are assessed and normal.  We confirm a satisfactory hemostasis.  Retractors and sponges are removed. Under fascia hemostasis is completed with cauterization.  The Parietal Peritoneum is closed with a running suture of Vicryl 2-0. The fascia is then closed with 2 running sutures of 0 Vicryl meeting midline. The wound is irrigated with warm saline and hemostasis is completed with cauterization. The adipose tissue is closed with a Plain 2-0 in a running suture. The skin is closed with a subcuticular suture of 3-0 Monocryl and Dermabond.  A Honeycomb and pressure dressing are added.  Instrument and sponge count is complete x2. Estimated blood loss is 750 cc.  The procedure is well tolerated by the patient who is taken to recovery room in a well and stable condition.  A female baby was born at 15:52 and received an Apgars pending.  Weight was pending.   Specimen: Placenta sent to patho  [name] [name],MARIE-LYNE MD 1/20/20164:42 PM

## 2014-02-20 NOTE — Anesthesia Preprocedure Evaluation (Addendum)
Anesthesia Evaluation  Patient identified by MRN, date of birth, ID band Patient awake    Reviewed: Allergy & Precautions, H&P , NPO status , Patient's Chart, lab work & pertinent test results  Airway Mallampati: I  TM Distance: >3 FB Neck ROM: full    Dental no notable dental hx.    Pulmonary neg pulmonary ROS,    Pulmonary exam normal       Cardiovascular negative cardio ROS      Neuro/Psych negative neurological ROS  negative psych ROS   GI/Hepatic negative GI ROS, Neg liver ROS,   Endo/Other  diabetes  Renal/GU negative Renal ROS     Musculoskeletal   Abdominal Normal abdominal exam  (+)   Peds  Hematology negative hematology ROS (+)   Anesthesia Other Findings   Reproductive/Obstetrics (+) Pregnancy                            Anesthesia Physical Anesthesia Plan  ASA: II and emergent  Anesthesia Plan: Spinal   Post-op Pain Management:    Induction:   Airway Management Planned:   Additional Equipment:   Intra-op Plan:   Post-operative Plan:   Informed Consent: I have reviewed the patients History and Physical, chart, labs and discussed the procedure including the risks, benefits and alternatives for the proposed anesthesia with the patient or authorized representative who has indicated his/her understanding and acceptance.     Plan Discussed with: CRNA and Surgeon  Anesthesia Plan Comments:         Anesthesia Quick Evaluation

## 2014-02-20 NOTE — Anesthesia Postprocedure Evaluation (Signed)
Anesthesia Post Note  Patient: [name] [name]  Procedure(s) Performed: Procedure(s) (LRB): CESAREAN SECTION (N/A)  Anesthesia type: Spinal  Patient location: PACU  Post pain: Pain level controlled  Post assessment: Post-op Vital signs reviewed  Last Vitals:  Filed Vitals:   02/20/14 1745  BP: 110/52  Pulse: 88  Temp:   Resp: 20    Post vital signs: Reviewed  Level of consciousness: awake  Complications: No apparent anesthesia complications. Dropping blood sugar level promptly reported by PACU nurse. Steps to avoid hypoglycemia including D50 and apple juice implemented.

## 2014-02-20 NOTE — Transfer of Care (Signed)
Immediate Anesthesia Transfer of Care Note  Patient: [name] [name]  Procedure(s) Performed: Procedure(s): CESAREAN SECTION (N/A)  Patient Location: PACU  Anesthesia Type:Spinal  Level of Consciousness: awake, alert  and oriented  Airway & Oxygen Therapy: Patient Spontanous Breathing  Post-op Assessment: Report given to PACU RN and Post -op Vital signs reviewed and stable  Post vital signs: Reviewed and stable  Complications: No apparent anesthesia complications

## 2014-02-20 NOTE — Progress Notes (Signed)
Visited w/pt and husband just before she was taken to OR. Pt said she didn't know what was going to happen - this is her first baby. I and the nurse assured her that this is not the hospital's first baby and they would take good care of her. Pt and husband wanted prayer before going to er. After prayer, pt and staff left w/husband (who is also a Theme park manager). Ernest Haber Chaplain   02/20/14 1500  Clinical Encounter Type  Visited With Patient and family together

## 2014-02-20 NOTE — H&P (Signed)
[name] [name] is a 34 y.o. female G1P0 [redacted]w[redacted]d presenting for non reassuring fetal well-being.  HPP:  IDDM who had an [place] at office yesterday, EFW 7+ Lbs, AFI wnl, but fetal heart was found to be enlarged.  Seen by MFM today:  BPP 2/8 with fetal cardiomegaly and ascites.  Reverse flow on Doppler.  Sent to L+D immediately for delivery.  OB History    Gravida Para Term Preterm AB TAB SAB Ectopic Multiple Living   1              Past Medical History  Diagnosis Date  . Diabetes mellitus without complication    Past Surgical History  Procedure Laterality Date  . No past surgeries     Family History: family history is not on file. Social History:  reports that she has never smoked. She has never used smokeless tobacco. She reports that she does not drink alcohol or use illicit drugs.  Allergies  Allergen Reactions  . Sulfa Antibiotics Other (See Comments)    shaking      Last menstrual period 05/28/2013. Exam Physical Exam   Per Dr Garwin Brothers:  Cervix long/closed  FHR monitoring Non reactive.  BPP 2/10.  HPP:  Patient Active Problem List   Diagnosis Date Noted  . Pregnancy 02/20/2014  . Non-stress test nonreactive   . Uterine fibroids affecting pregnancy in third trimester, antepartum   . [redacted] weeks gestation of pregnancy   . IDDM (insulin dependent diabetes mellitus) 05/27/2012    Prenatal labs: ABO, Rh:  O+ Antibody:  Neg Rubella:  Immune RPR:   NR HBsAg:   NR HIV:   NR Genetic testing: Informaseq wnl, female.  AFP1 neg [place] anato: wnl, Fetal Echo wnl in 2nd trimester GBS:  Pos  Assessment/Plan: 38+ wks IDDM with non-reassuring Fetal well-being with BPP 2/10 and Fetal Cardiomegaly with ascites.  Urgent C/S procedure and risks reviewed.  Patient ate solids 1 1/2 hr ago, risk of aspiration discussed, anesthesia, Dr Jillyn Hidden informed.  Informed consent signed.  Ivin Rosenbloom,MARIE-LYNE 02/20/2014, 3:15 PM

## 2014-02-20 NOTE — ED Notes (Signed)
Report called to [facility], Pt to room 163 via wheelchair.

## 2014-02-21 ENCOUNTER — Encounter (HOSPITAL_COMMUNITY): Payer: Self-pay | Admitting: Obstetrics and Gynecology

## 2014-02-21 LAB — GLUCOSE, CAPILLARY
GLUCOSE-CAPILLARY: 152 mg/dL — AB (ref 70–99)
GLUCOSE-CAPILLARY: 163 mg/dL — AB (ref 70–99)
GLUCOSE-CAPILLARY: 170 mg/dL — AB (ref 70–99)
Glucose-Capillary: 167 mg/dL — ABNORMAL HIGH (ref 70–99)
Glucose-Capillary: 189 mg/dL — ABNORMAL HIGH (ref 70–99)
Glucose-Capillary: 206 mg/dL — ABNORMAL HIGH (ref 70–99)

## 2014-02-21 LAB — CBC
HCT: 30.5 % — ABNORMAL LOW (ref 36.0–46.0)
HEMOGLOBIN: 10.6 g/dL — AB (ref 12.0–15.0)
MCH: 30.3 pg (ref 26.0–34.0)
MCHC: 34.8 g/dL (ref 30.0–36.0)
MCV: 87.1 fL (ref 78.0–100.0)
PLATELETS: 116 10*3/uL — AB (ref 150–400)
RBC: 3.5 MIL/uL — ABNORMAL LOW (ref 3.87–5.11)
RDW: 14.1 % (ref 11.5–15.5)
WBC: 12.5 10*3/uL — ABNORMAL HIGH (ref 4.0–10.5)

## 2014-02-21 LAB — RPR: RPR Ser Ql: NONREACTIVE

## 2014-02-21 MED ORDER — INSULIN LISPRO 100 UNIT/ML (KWIKPEN)
2.0000 [IU] | PEN_INJECTOR | Freq: Three times a day (TID) | SUBCUTANEOUS | Status: DC
  Administered 2014-02-21: 2 [IU] via SUBCUTANEOUS
  Administered 2014-02-21: 8 [IU] via SUBCUTANEOUS
  Administered 2014-02-21 – 2014-02-22 (×4): 7 [IU] via SUBCUTANEOUS
  Administered 2014-02-23: 8 [IU] via SUBCUTANEOUS
  Filled 2014-02-21: qty 3

## 2014-02-21 MED ORDER — PNEUMOCOCCAL VAC POLYVALENT 25 MCG/0.5ML IJ INJ
0.5000 mL | INJECTION | INTRAMUSCULAR | Status: DC
  Filled 2014-02-21: qty 0.5

## 2014-02-21 MED ORDER — INFLUENZA VAC SPLIT QUAD 0.5 ML IM SUSY: 0.5000 mL | PREFILLED_SYRINGE | INTRAMUSCULAR | Status: DC

## 2014-02-21 NOTE — Progress Notes (Signed)
Ur chart review completed.  

## 2014-02-21 NOTE — Addendum Note (Signed)
Addendum  created 02/21/14 0810 by Asher Muir, CRNA   Modules edited: Notes Section   Notes Section:  File: [id number]

## 2014-02-21 NOTE — Anesthesia Postprocedure Evaluation (Signed)
Anesthesia Post Note  Patient: [name] [name]  Procedure(s) Performed: Procedure(s) (LRB): CESAREAN SECTION (N/A)  Anesthesia type: Spinal  Patient location: Mother/Baby  Post pain: Pain level controlled  Post assessment: Post-op Vital signs reviewed  Last Vitals:  Filed Vitals:   02/21/14 0528  BP: 109/62  Pulse: 81  Temp: 37 C  Resp: 16    Post vital signs: Reviewed  Level of consciousness: awake  Complications: No apparent anesthesia complications

## 2014-02-21 NOTE — Progress Notes (Signed)
Clinical Social Work Department BRIEF PSYCHOSOCIAL ASSESSMENT 02/21/2014  Patient:  [name],[name]     Account Number:  [id number]     Admit date:  02/20/2014  Clinical Social Worker:  Liyah Higham, CLINICAL SOCIAL WORKER  Date/Time:  02/21/2014 03:15 PM  Referred by:  CSW  Date Referred:  02/21/2014 Referred for  Other - See comment   Other Referral:   NICU admission   Interview type:  Patient  PSYCHOSOCIAL DATA Living Status:  FAMILY Primary support name:  [name] [name] Primary support relationship to patient:  SPOUSE Degree of support available:   MOB reported strong and supportive relationship with the FOB.  She also endorsed family support in [place].   CURRENT CONCERNS Current Concerns  Adjustment to Illness/Adjusting to NICU admission   Other Concerns:  No additional concerns  SOCIAL WORK ASSESSMENT / PLAN CSW met with the MOB in her room to complete assessment and to provide emotional support due to NICU admission.  MOB was sitting up in her bed and alone in her room since the FOB had left in order to pick up the MGM from the airport.  She expressed excitement for her mother to arrive (arriving from [place]) and shared gratitude that they were able to change the MGM's arrival to today instead of tomorrow (due to pending winterstorm).   The MOB displayed a full range in affect and was in a pleasant mood, but she was noted to be appear anxious/nervous when CSW assisted the MOB to process and to explore her thoughts and feelings secondary to the birth of "[name] [name]". Throughout the visit, CSW provided supportive listening and validated/normalized her feelings.  She may not be fully ready to process the full extent of the events that led to the NICU admission as she was hesitant to directly state the events that led to the admission, avoided eye contact when discussing the events, and her leg was noted to be shaking. The MOB originally reported feeling fortunate/gracious that she  had a MD appointment yesterday since they were able to determine need to deliver the baby yesterday instead of her waiting until 1/25 (as originally scheduled); however, she also acknowledged feeling sad and scared prior to delivering the baby.  She shared that she feels less scared now since he has been born, but stated that she continues to feel sad because she wants to hear him cry and wants to hold him.  MOB expressed hope that she will be able to hold him soon, and discussed that she is attempting to take it "one moment at a time". CSW continued to explore normative thoughts and feelings secondary to unanticipated delivery and NICU admission.   MOB acknowledged likelihood that she will be discharged prior to the baby, and acknowledged that there may be an increase in anxiety as she wonders about his health when she is not at his bedside.  She shared awareness of ability to call/visit the NICU at any time, and she denied any barriers to visiting the NICU.  MOB presents with insight that the NICU admission is temporary, and she shared belief that her awareness of the temporal nature of the situation is also helping her to cope with the NICU.    MOB denied mental health history.  She presented as receptive and engaged when CSW provided education on postpartum depression/mood disorders.  CSW provided education on risk/protective factors for PPD, including increased risk due to NICU admission and emotionally difficult delivery.  MOB acknowledged increased risk and expressed intention to   follow up with her MD if she notes symptoms.   CSW ended assessment due to FOB and MGM arriving.  MOB smiled brightly and expressed excitement upon her arrival.  CSW introduced self to FOB and discussed ongoing availability to provide emotional support while baby is in the NICU.   No barriers to discharge.  Assessment/plan status:  No Further Intervention Required/No barriers to discharge Other assessment/ plan:   CSW to  follow up with MOB and FOB PRN in order to provide ongoing emotional support.   Information/referral to community resources:   No needs noted at this time.   PATIENT'S/FAMILY'S RESPONSE TO PLAN OF CARE: MOB expressed appreication for the visit and acknowledged ongoing support available to her and the FOB while in the NICU. She expressed intention to notify her MD if she notes any symptoms of postpartum depression.

## 2014-02-21 NOTE — Lactation Note (Signed)
This note was copied from the chart of [place]. Lactation Consultation Note     Initial consult with this mom of a NICU baby, now 63 hours old and full term. Mom has been pumping. I showed her how to set premie setting, and how to hand express. She does not have any colostrum yet, and i explained that this is normal, and to how supply and demand works. Mom has a personal DEP at home. Mom encouraged to pump every 3 hours, after visiting her baby. Mom will call for questions/concerns.   Patient Name: Boy Shawnie Nicole HWKGS'U Date: 02/21/2014 Reason for consult: Initial assessment;NICU baby;Other (Comment) (term baby with large heart and PPHN)   Maternal Data Formula Feeding for Exclusion: Yes (baby in the NICU) Has patient been taught Hand Expression?: Yes Does the patient have breastfeeding experience prior to this delivery?: No  Feeding    LATCH Score/Interventions                      Lactation Tools Discussed/Used WIC Program: No Pump Review: Setup, frequency, and cleaning;Milk Storage;Other (comment) (hand expression and premie setting, and review of NICU booklet) Initiated by:: bedside Rn Date initiated:: 02/20/14   Consult Status Consult Status: Follow-up Date: 02/22/14 Follow-up type: In-patient    Tonna Corner 02/21/2014, 11:09 AM

## 2014-02-21 NOTE — Progress Notes (Addendum)
Patient ID: [name] [name], female   DOB: 1980-10-28, 34 y.o.   MRN: [id number] Subjective: POD# 1 Information for the patient's newborn:  Haueter, Boy Sehaj [[id number]]  female  / circ baby in NICU - planning prior to d/c home  Reports feeling well Feeding: bottle Patient reports tolerating PO.  Breast symptoms: no colostrum - only pumped 1 time last night Pain controlled with ibuprofen (OTC) and narcotic analgesics including Percocet Denies HA/SOB/C/P/N/V/dizziness. Flatus absent. She reports vaginal bleeding as normal, without clots.  She is ambulating, urinating without difficult.     Objective:   VS:  Filed Vitals:   02/21/14 0202 02/21/14 0400 02/21/14 0528 02/21/14 1000  BP: 110/63 103/66 109/62 108/66  Pulse: 76 73 81 90  Temp: 98.6 F (37 C) 98.4 F (36.9 C) 98.6 F (37 C) 98.5 F (36.9 C)  TempSrc: Oral Oral Oral Oral  Resp: 16 15 16 18   Height:      Weight:      SpO2: 96% 97% 98% 95%     Intake/Output Summary (Last 24 hours) at 02/21/14 1035 Last data filed at 02/21/14 1008  Gross per 24 hour  Intake 4667.1 ml  Output   3325 ml  Net 1342.1 ml        Recent Labs  02/20/14 1510 02/21/14 0520  WBC 12.7* 12.5*  HGB 14.1 10.6*  HCT 41.4 30.5*  PLT 145* 116*     Blood type: O/Positive/-- (01/20 1533)  Rubella: Immune (01/20 1533)     Physical Exam:  General: alert, cooperative and no distress CV: Regular rate and rhythm, S1S2 present or without murmur or extra heart sounds Resp: clear Abdomen: soft, nontender, normal bowel sounds Incision: Pressure dressing loosely covering Tegaderm and Honeycomb - C/D/I - skin well-approximated with sutures Uterine Fundus: firm, 1 FB below umbilicus, nontender Lochia: minimal Ext: extremities normal, atraumatic, no cyanosis or edema, Homans sign is negative, no sign of DVT and no edema, redness or tenderness in the calves or thighs   Assessment/Plan: 34 y.o.   POD# 1.  s/p Cesarean Delivery.  Indications:  fetal heart anomalies and ascites                Principal Problem:   Postpartum care following cesarean delivery (1/20) Active Problems:   Pregnancy   Postoperative state  Doing well, stable.               Regular diet as tolerated D/C IV per unit protocol  Pump frequently every 2-3 hours to increase colostrum Ambulate frequently Maintain good hydration by drinking at least 10 glasses of water Routine post-op care  Graceann Congress, MSN, CNM 02/21/2014, 10:35 AM

## 2014-02-22 LAB — GLUCOSE, CAPILLARY
GLUCOSE-CAPILLARY: 141 mg/dL — AB (ref 70–99)
GLUCOSE-CAPILLARY: 225 mg/dL — AB (ref 70–99)
Glucose-Capillary: 142 mg/dL — ABNORMAL HIGH (ref 70–99)
Glucose-Capillary: 130 mg/dL — ABNORMAL HIGH (ref 70–99)
Glucose-Capillary: 232 mg/dL — ABNORMAL HIGH (ref 70–99)

## 2014-02-22 MED ORDER — MAGNESIUM OXIDE 400 (241.3 MG) MG PO TABS
200.0000 mg | ORAL_TABLET | Freq: Every day | ORAL | Status: DC
  Administered 2014-02-23: 200 mg via ORAL
  Filled 2014-02-22 (×2): qty 0.5

## 2014-02-22 MED ORDER — PNEUMOCOCCAL VAC POLYVALENT 25 MCG/0.5ML IJ INJ
0.5000 mL | INJECTION | INTRAMUSCULAR | Status: AC
  Administered 2014-02-23: 0.5 mL via INTRAMUSCULAR
  Filled 2014-02-22: qty 0.5

## 2014-02-22 NOTE — Progress Notes (Signed)
   02/22/14 1300  Clinical Encounter Type  Visited With Patient and family together (mom Alden Benjamin and husband Saralyn Pilar)  Visit Type Follow-up;Spiritual support;Social support  Referral From [place] was in good spirits on this follow-up visit.  She and Saralyn Pilar welcomed pastoral presence; he noted, "I feel better knowing you're here."  Nora and chaplain availability, encouraging family to reach out anytime.  Please also page as needs arise.  Thank you.  [place], [place]

## 2014-02-22 NOTE — Progress Notes (Addendum)
1/21 Spoke with patient and requested the ratios the pt is using to calculate her men. humalog doses. Pt states she is allowing RN's to give her doses, however she has been telling them how much to give. She bases her correction on 1 unit for every 50 mg/dl greater than her target glucose of 120 mg/dL. Her carb coverage is 1 unit per 10 grams carb-both calculated at the same time and given per RN.She states these are the guidelines given her by Dr. Chalmers Cater for post-delivery regimen. Will adjust humalog orders accordingly per consult request.  Thank you, Rosita Kea, RN, CNS, Diabetes Coordinator [phone]) (prior to delivery, pt was taking 39 units lantus and 1 unit per 6 gms carb and higher correction factor.) Explained to her that 2 hr pp cbg's were no longer needed following delivery) [facility]

## 2014-02-22 NOTE — Progress Notes (Addendum)
Referral received regarding insulin dosing (clarification). Noted present orders for lantus 15 units and novolog (humalog) 0-24aunits (SSI based on cbg and carbohydrate intake).  Home meds prior to delivery: Lantus 39 units and humalog SSI 2-40 units tidwc.  Results for EDELIN, FRYER (MRN [id number]) as of 02/22/2014 11:44  Ref. Range 02/20/2014 23:59 02:14 02/21/2014 09:30 02/21/2014 15:10 02/21/2014 20:45 02/21/2014 23:55  Glucose-Capillary Latest Range: 70-99 mg/dL 167 (H) 189 (H) 170 (H) 206 (H) 163 (H) 152 (H)    Delivered per c/section last night at 38 weeks for non-reassuring  FHR. Noted humalog has been given assuming pt telling RN  what dose she is taking according to pt calculations. Order needs be clarified as to sensitivity factor, glucose goal and insulin no carb ratio. Called pt in room and spoke with pt's husband. Pt presently in shower and requested I call back in 30-45 mins. Pt is managed by Dr. Chalmers Cater (per husband). Will call pt when out of shower and get a more clarified order for analogue dosing. Thank you, Rosita Kea, RN, CNS, Diabetes Coordinator [phone]) Ad: Spoke with Raquel Sarna RN, who states pt requested cbg's be done tid wc, HS and 2 hr pp. Pt does not need the 2 hr pp glucose taken since that was done for the purpose of assessing meal coverage adequacy during pregnancy. AC

## 2014-02-22 NOTE — Progress Notes (Signed)
POD # 2  Subjective: Pt reports feeling well/ Pain controlled with Motrin and Percocet Tolerating po/Voiding without problems/ No n/v/ Flatus present Activity: ad lib Bleeding is light Newborn info:  Information for the patient's newborn:  Anesha, Hackert [[id number]]  female   Feeding: breastpumping/NICU   Objective: VS:  Filed Vitals:   02/21/14 1000 02/21/14 1400 02/21/14 2230 02/22/14 0607  BP: 108/66 113/75 117/63 111/74  Pulse: 90 85 82 88  Temp: 98.5 F (36.9 C) 98.6 F (37 C) 98.4 F (36.9 C) 98.3 F (36.8 C)  TempSrc: Oral Oral Oral Oral  Resp: 18 18 18 18   Height:      Weight:      SpO2: 95% 99% 100% 100%    I&O: Intake/Output      01/21 0701 - 01/22 0700 01/22 0701 - 01/23 0700   P.O. 120    I.V. (mL/kg)     Total Intake(mL/kg) 120 (1.4)    Urine (mL/kg/hr) 750 (0.4)    Blood     Total Output 750     Net -630            FBS: 128 (pt self check) BS AC:[id number]  LABS:  Recent Labs  02/20/14 1510 02/21/14 0520  WBC 12.7* 12.5*  HGB 14.1 10.6*  HCT 41.4 30.5*  PLT 145* 116*    Blood type: O/Positive/-- (01/20 1533) Rubella: Immune (01/20 1533)     Physical Exam:  General: alert and cooperative CV: Regular rate and rhythm Resp: CTA bilaterally Abdomen: soft, nontender, normal bowel sounds Uterine Fundus: firm, below umbilicus, nontender Incision: Covered with Tegaderm and honeycomb dressing; no significant drainage, edema, bruising, or erythema; well approximated with suture Lochia: minimal Ext: extremities normal, atraumatic, no cyanosis or edema and Homans sign is negative, no sign of DVT    Assessment/: POD # 2/ G1P1001/ S/P C/Section d/t fetal cardiomegaly and ascites IDDM Gestational thrombocytopenia, delivered-stable ABL anemia  Plan: AC BS remain elevated Continue current insulin regimen Add CBG fasting, and postprandial; continue AC and hs Consult with Dr. Garwin Brothers regarding BS-recommend consult with Dr. Chalmers Cater or  diabetic coordinator Attempt to reach Dr. Chalmers Cater unsuccessful  Consult diabetic coordinator today Continue routine post op orders Anticipate discharge home tomorrow    Signed: Graciela Husbands, MSN, CNM 02/22/2014, 9:55 AM

## 2014-02-22 NOTE — Lactation Note (Signed)
This note was copied from the chart of [employer]. Lactation Consultation Note Follow up consultation; mom states she is pumping regularly, and hand expressing; still is not getting any colostrum yet.  Enc mom to continue, and to call for help if needed.   Patient Name: [name] [name] ZLDJT'T Date: 02/22/2014     Maternal Data    Feeding    LATCH Score/Interventions                      Lactation Tools Discussed/Used     Consult Status      Dorise Bullion 02/22/2014, 9:27 AM

## 2014-02-23 LAB — GLUCOSE, CAPILLARY
GLUCOSE-CAPILLARY: 123 mg/dL — AB (ref 70–99)
Glucose-Capillary: 151 mg/dL — ABNORMAL HIGH (ref 70–99)

## 2014-02-23 MED ORDER — OXYCODONE-ACETAMINOPHEN 5-325 MG PO TABS: 1.0000 | ORAL_TABLET | ORAL | Status: AC | PRN

## 2014-02-23 MED ORDER — INFLUENZA VAC SPLIT QUAD 0.5 ML IM SUSY: 0.5000 mL | PREFILLED_SYRINGE | INTRAMUSCULAR | Status: DC | PRN

## 2014-02-23 MED ORDER — MAGNESIUM OXIDE 400 (241.3 MG) MG PO TABS
400.0000 mg | ORAL_TABLET | Freq: Every day | ORAL | Status: DC
  Filled 2014-02-23: qty 1

## 2014-02-23 MED ORDER — INSULIN LISPRO 100 UNIT/ML ~~LOC~~ SOLN: 2.0000 [IU] | Freq: Three times a day (TID) | SUBCUTANEOUS | Status: AC

## 2014-02-23 MED ORDER — INSULIN ASPART 100 UNIT/ML ~~LOC~~ SOLN
2.0000 [IU] | Freq: Three times a day (TID) | SUBCUTANEOUS | Status: DC
  Administered 2014-02-23: 8 [IU] via SUBCUTANEOUS

## 2014-02-23 MED ORDER — FERROUS SULFATE 325 (65 FE) MG PO TABS: 325.0000 mg | ORAL_TABLET | Freq: Two times a day (BID) | ORAL | Status: AC

## 2014-02-23 MED ORDER — INSULIN GLARGINE 100 UNIT/ML ~~LOC~~ SOLN: 15.0000 [IU] | Freq: Every day | SUBCUTANEOUS | Status: AC

## 2014-02-23 MED ORDER — MAGNESIUM OXIDE 400 (241.3 MG) MG PO TABS: 400.0000 mg | ORAL_TABLET | Freq: Every day | ORAL | Status: AC

## 2014-02-23 MED ORDER — BUTALBITAL-APAP-CAFFEINE 50-325-40 MG PO TABS
2.0000 | ORAL_TABLET | Freq: Four times a day (QID) | ORAL | Status: DC | PRN
  Administered 2014-02-23: 2 via ORAL
  Filled 2014-02-23: qty 2

## 2014-02-23 MED ORDER — BUTALBITAL-APAP-CAFFEINE 50-325-40 MG PO TABS: 1.0000 | ORAL_TABLET | Freq: Four times a day (QID) | ORAL | Status: AC | PRN

## 2014-02-23 NOTE — Progress Notes (Signed)
Discharge instructions reviewed with patient.  Patient states understanding of home care, medications, activity, signs/symptoms to report to MD and return MD office visit.  Patients significant other and family will assist with her care @ home.  Prescriptions given to patient, has all personal belongings and no home equipment needed.  Patient ambulated for discharge in stable condition with staff without incident.

## 2014-02-23 NOTE — Discharge Instructions (Signed)
Remove plastic dressing Monday - leave glue over incision

## 2014-02-23 NOTE — Consult Note (Signed)
Requested evaluation of patient for complaint of headache post spinal.  Patient states continues to have sore neck and a headache.[name] [name] identify location of headache.  .  Some relief with one Percocet and improved relief with two Percocets.  Asked patient to sit on side of bed throughout discussion and patient did not indicate any increase in headache pain and stated it was better because of recent medication.  Appears to be in no distress.  Encouraged fluids and informed patient if pain worsens, does not resolve in next several days, can call for follow up.    Informed Dr. Lyndle Herrlich of assessment and he agrees.

## 2014-02-23 NOTE — Addendum Note (Signed)
Addendum  created 02/23/14 1218 by Hubbard Robinson, CRNA   Modules edited: Clinical Notes   Clinical Notes:  File: [id number]; Pend: [id number]

## 2014-02-23 NOTE — Discharge Summary (Signed)
POSTOPERATIVE DISCHARGE SUMMARY:  Patient ID: [name] [name] MRN: [id number] DOB/AGE: Sep 02, 1980 34 y.o.  Admit date: 02/20/2014 Admission Diagnoses: 38.2 weeks / Type 1 IDDM / Abnormal fetal doppler / abnormal fetal cardiac assessment with new onset cardiomegaly  Discharge date:  02/23/2014 Discharge Diagnoses: POD s/p cesarean section - fetal indications   Prenatal history: G1P1001   EDC : 03/04/2014, by Last Menstrual Period  Prenatal care at [facility]  Primary provider : [facility] / Dr Chalmers Cater - Endocrinology Prenatal course complicated by IDDM  Prenatal Labs: ABO, Rh: O/Positive/-- (01/20 1533)  Antibody: Negative (01/20 1533) Rubella: Immune (01/20 1533)  RPR: Nonreactive (01/20 1533)  HBsAg: Negative (01/20 1533)  HIV: Non-reactive (01/20 1533)  GTT : IDDM - preexisting ( a1c 8 to 6 in pregnancy) GBS: Positive (01/20 1533)   Medical / Surgical History :  Past medical history:  Past Medical History  Diagnosis Date  . Diabetes mellitus without complication     Past surgical history:  Past Surgical History  Procedure Laterality Date  . No past surgeries    . Cesarean section N/A 02/20/2014    Procedure: CESAREAN SECTION;  Surgeon: Marvene Staff, MD;  Location: [facility];  Service: Obstetrics;  Laterality: N/A;    Family History: History reviewed. No pertinent family history.  Social History:  reports that she has never smoked. She has never used smokeless tobacco. She reports that she does not drink alcohol or use illicit drugs.  Allergies: Sulfa antibiotics   Current Medications at time of admission:  PNV daily Lantus insulin at HS - 36 units Humalog - sliding scale  Procedures: Cesarean section delivery on 02/20/2014 with delivery of female newborn by Dr Dellis Filbert per recommendation of MFM due to sudden onset of fetal cardiomegaly / ascites / BPP 2/8 with absent and reverse doppler assessment   See operative report for further  details APGAR (1 MIN): 6   APGAR (5 MINS): 6    Cord PH 6.95  Postoperative / postpartum course:  Uncomplicated with discharge on POD 3  Discharge Instructions:  Discharged Condition: stable  Activity: pelvic rest for x 6 weeks  and postoperative restrictions x 2 weeks For  Diet: carb restricted  Medications:    Medication List    TAKE these medications        butalbital-acetaminophen-caffeine 50-325-40 MG per tablet  Commonly known as:  FIORICET, ESGIC  Take 1-2 tablets by mouth every 6 (six) hours as needed for headache.     ferrous sulfate 325 (65 FE) MG tablet  Take 1 tablet (325 mg total) by mouth 2 (two) times daily with a meal.     insulin glargine 100 UNIT/ML injection  Commonly known as:  LANTUS  Inject 0.15 mLs (15 Units total) into the skin at bedtime.     insulin lispro 100 UNIT/ML injection  Commonly known as:  HUMALOG  Inject 0.02-0.4 mLs (2-40 Units total) into the skin 3 (three) times daily before meals. 1 unit / 10 grams of carb PLUS if CBG prior to meal is over 150 - add 1 unit for every 50gm/dl     magnesium oxide 400 (241.3 MG) MG tablet  Commonly known as:  MAG-OX  Take 1 tablet (400 mg total) by mouth daily.  Start taking on:  02/24/2014     oxyCODONE-acetaminophen 5-325 MG per tablet  Commonly known as:  PERCOCET/ROXICET  Take 1 tablet by mouth every 4 (four) hours as needed (for pain scale less than  7).     prenatal multivitamin Tabs tablet  Take 1 tablet by mouth daily at 12 noon.     vitamin C 500 MG tablet  Commonly known as:  ASCORBIC ACID  Take 500 mg by mouth daily.        Wound Care: keep clean and dry / remove honeycomb POD 5 Postpartum Instructions: [facility] discharge booklet - instructions reviewed  Discharge to: Home  Follow up :  Dr Chalmers Cater within next 2 weeks - call for appointment date and time [facility] in 6 weeks for routine postpartum visit with Dr Garwin Brothers                 Signed: Artelia Laroche CNM, MSN,  [facility] 02/23/2014, 11:30 AM

## 2014-02-23 NOTE — Progress Notes (Signed)
POSTOPERATIVE DAY # 3 S/P cesarean section  S:         Reports feeling generally poorly - gas pain and pressure / neck pain / headache             States worst headache ever - percocet helps as long as she lies still                                                             - not ambulating much due to headache                                                            - headache started yesterday and not stopped             Tolerating po intake / no nausea / no vomiting / some flatus / no BM             Bleeding is spotting             Pain controlled with percocet and Ibuprofen             Up ad lib / ambulatory/ voiding QS  Newborn in NICU - planning breast and bottle feeding  / trying to pump  O:  VS: BP 112/63 mmHg  Pulse 88  Temp(Src) 98.5 F (36.9 C) (Oral)  Resp 18  Ht 5\' 7"  (1.702 m)  Wt 85.73 kg (189 lb)  BMI 29.59 kg/m2  SpO2 99%  LMP 05/28/2013  Breastfeeding? Unknown   LABS: CBG pre-breakfast 123 this am (took 8 units Humalog)   1/22 : pre-breakfast 142 (took 7 units) / pre-lunch 130 (took 7 units) / pre-dinner 232 (7 units)    Not adjusting for pre-meal CBG - only dosing based on her carbs                Recent Labs  02/20/14 1510 02/21/14 0520  WBC 12.7* 12.5*  HGB 14.1 10.6*  PLT 145* 116*               Bloodtype: O/Positive/-- (01/20 1533)  Rubella: Immune (01/20 1533)                                Physical Exam:             Alert and Oriented X3  Lungs: Clear and unlabored  Heart: regular rate and rhythm / no mumurs  Abdomen: soft, non-tender, mildly distended, hypoactive BS             Fundus: firm, non-tender, U-2             Dressing intact honeycomb              Incision:  no erythema / no ecchymosis / no drainage  Perineum: intact  Lochia: spotting  Extremities: trace edema, no calf pain or tenderness, negative Homans  A:        POD # 3 S/P CS            Type  1 DM - IDDM  P:        routine postoperative care              anesthesia to  evaluate headache - rule out spinal headache as etiology             add sliding scale to adjust carb dose to account for pre-meal CBG today              plan home when stable  Artelia Laroche CNM, MSN, [facility] 02/23/2014, 10:18 AM

## 2014-02-25 ENCOUNTER — Inpatient Hospital Stay (HOSPITAL_COMMUNITY): Admission: RE | Admit: 2014-02-25 | Payer: 59 | Source: Ambulatory Visit

## 2014-03-01 ENCOUNTER — Ambulatory Visit: Payer: Self-pay

## 2014-03-01 NOTE — Lactation Note (Signed)
This note was copied from the chart of [facility]. Lactation Consultation Note  Follow up visit with mom.  Mom is rooming in tonight and tomorrow night.  I asked mom if she had any questions about pumping.  She states she is not pumping often enough but usually obtains 30 mls.  Reviewed supply and demand and encouraged to pump every 3 hours to establish and maintain milk supply.  Last pumping was last evening.  Mom states her breasts were full yesterday but softer today.  Offered assist with latching baby to breast and she will call if assist desired.  Explained to mom that we would still supplement baby with 22 calorie formula after breastfeeding.  Will follow up prn.  Patient Name: [name] [name] CEYEM'V Date: 03/01/2014     Maternal Data    Feeding Feeding Type: Formula Nipple Type: Slow - flow Length of feed: 20 min  LATCH Score/Interventions                      Lactation Tools Discussed/Used     Consult Status      [facility] 03/01/2014, 4:59 PM

## 2017-06-15 ENCOUNTER — Ambulatory Visit: Payer: 59 | Admitting: Podiatry
